# Patient Record
Sex: Female | Born: 1995 | Race: White | Hispanic: No | Marital: Single | Smoking: Current some day smoker
Health system: Southern US, Community
[De-identification: ages and names within clinical notes are randomized; demographics above are authoritative.]

---

## 2017-01-08 ENCOUNTER — Encounter (HOSPITAL_BASED_OUTPATIENT_CLINIC_OR_DEPARTMENT_OTHER): Payer: Self-pay | Admitting: Emergency Medicine

## 2017-01-08 ENCOUNTER — Emergency Department (HOSPITAL_BASED_OUTPATIENT_CLINIC_OR_DEPARTMENT_OTHER)
Admission: EM | Admit: 2017-01-08 | Discharge: 2017-01-08 | Disposition: A | Discharge status: Home / Self Care | Payer: Managed Care, Other (non HMO) | Attending: Emergency Medicine | Admitting: Emergency Medicine

## 2017-01-08 ENCOUNTER — Emergency Department (HOSPITAL_BASED_OUTPATIENT_CLINIC_OR_DEPARTMENT_OTHER): Payer: Managed Care, Other (non HMO)

## 2017-01-08 DIAGNOSIS — J040 Acute laryngitis: Secondary | ICD-10-CM | POA: Insufficient documentation

## 2017-01-08 DIAGNOSIS — Z87891 Personal history of nicotine dependence: Secondary | ICD-10-CM | POA: Insufficient documentation

## 2017-01-08 DIAGNOSIS — R590 Localized enlarged lymph nodes: Secondary | ICD-10-CM | POA: Diagnosis not present

## 2017-01-08 DIAGNOSIS — J029 Acute pharyngitis, unspecified: Secondary | ICD-10-CM | POA: Diagnosis present

## 2017-01-08 DIAGNOSIS — J042 Acute laryngotracheitis: Secondary | ICD-10-CM

## 2017-01-08 LAB — COMPREHENSIVE METABOLIC PANEL
ALK PHOS: 57 U/L (ref 38–126)
ALT: 16 U/L (ref 14–54)
ANION GAP: 10 (ref 5–15)
AST: 23 U/L (ref 15–41)
Albumin: 4.9 g/dL (ref 3.5–5.0)
BUN: 7 mg/dL (ref 6–20)
CALCIUM: 9.5 mg/dL (ref 8.9–10.3)
CO2: 27 mmol/L (ref 22–32)
CREATININE: 0.67 mg/dL (ref 0.44–1.00)
Chloride: 100 mmol/L — ABNORMAL LOW (ref 101–111)
Glucose, Bld: 93 mg/dL (ref 65–99)
Potassium: 3.6 mmol/L (ref 3.5–5.1)
SODIUM: 137 mmol/L (ref 135–145)
Total Bilirubin: 0.7 mg/dL (ref 0.3–1.2)
Total Protein: 8.3 g/dL — ABNORMAL HIGH (ref 6.5–8.1)

## 2017-01-08 LAB — CBC WITH DIFFERENTIAL/PLATELET
Basophils Absolute: 0 10*3/uL (ref 0.0–0.1)
Basophils Relative: 1 %
EOS ABS: 0 10*3/uL (ref 0.0–0.7)
EOS PCT: 1 %
HCT: 40.1 % (ref 36.0–46.0)
HEMOGLOBIN: 13.5 g/dL (ref 12.0–15.0)
LYMPHS ABS: 1.4 10*3/uL (ref 0.7–4.0)
LYMPHS PCT: 33 %
MCH: 29.5 pg (ref 26.0–34.0)
MCHC: 33.7 g/dL (ref 30.0–36.0)
MCV: 87.6 fL (ref 78.0–100.0)
MONOS PCT: 11 %
Monocytes Absolute: 0.5 10*3/uL (ref 0.1–1.0)
Neutro Abs: 2.3 10*3/uL (ref 1.7–7.7)
Neutrophils Relative %: 54 %
PLATELETS: 152 10*3/uL (ref 150–400)
RBC: 4.58 MIL/uL (ref 3.87–5.11)
RDW: 13.2 % (ref 11.5–15.5)
WBC: 4.2 10*3/uL (ref 4.0–10.5)

## 2017-01-08 LAB — URINALYSIS, ROUTINE W REFLEX MICROSCOPIC
BILIRUBIN URINE: NEGATIVE
GLUCOSE, UA: NEGATIVE mg/dL
HGB URINE DIPSTICK: NEGATIVE
KETONES UR: NEGATIVE mg/dL
Leukocytes, UA: NEGATIVE
Nitrite: NEGATIVE
PH: 7.5 (ref 5.0–8.0)
Protein, ur: NEGATIVE mg/dL
Specific Gravity, Urine: 1.003 — ABNORMAL LOW (ref 1.005–1.030)

## 2017-01-08 LAB — RAPID URINE DRUG SCREEN, HOSP PERFORMED
Amphetamines: NOT DETECTED
BARBITURATES: NOT DETECTED
Benzodiazepines: NOT DETECTED
Cocaine: NOT DETECTED
Opiates: NOT DETECTED
Tetrahydrocannabinol: NOT DETECTED

## 2017-01-08 LAB — PREGNANCY, URINE: PREG TEST UR: NEGATIVE

## 2017-01-08 LAB — MONONUCLEOSIS SCREEN: Mono Screen: NEGATIVE

## 2017-01-08 LAB — RAPID STREP SCREEN (MED CTR MEBANE ONLY): Streptococcus, Group A Screen (Direct): NEGATIVE

## 2017-01-08 MED ORDER — ACETAMINOPHEN 325 MG PO TABS
650.0000 mg | ORAL_TABLET | Freq: Once | ORAL | Status: AC
  Administered 2017-01-08: 650 mg via ORAL
  Filled 2017-01-08: qty 2

## 2017-01-08 MED ORDER — PREDNISONE 20 MG PO TABS: 40.0000 mg | ORAL_TABLET | Freq: Every day | ORAL | 0 refills | Status: AC

## 2017-01-08 MED ORDER — MELOXICAM 15 MG PO TABS: 15.0000 mg | ORAL_TABLET | Freq: Every day | ORAL | 0 refills | Status: AC

## 2017-01-08 NOTE — ED Triage Notes (Signed)
Patient states that she has had a sore throat x 2 -3 days

## 2017-01-08 NOTE — Discharge Instructions (Signed)
Your labs, and imaging of your chest were without any abnormalities today. You appear to have a viral infection. Please follow closely with a primary care doctor if your symptoms are worsening. Return to the ER if you have inability to swallow foods or fluids, inability to swallowing her own saliva, worsening swelling in the throat including difficulty breathing.

## 2017-01-08 NOTE — ED Notes (Signed)
ED-PA to bedside  

## 2017-01-08 NOTE — ED Provider Notes (Signed)
[facility]   CSN: [id number] Arrival date & time: 01/08/17  1647     History   Chief Complaint Chief Complaint  Patient presents with  . Sore Throat    HPI Carol Campbell is a 21 y.o. female who comes to the emergency department with her mother. She is from [place] and she is visiting this past week in [place], and she is beginning Doctor, hospital at [employer]. She states that 3 days initiated on Saturday, sore throat. She states that that night she ran a high fever and had soaking sweats. She states it is different from her normal strep throat infections because she has associated laryngitis and pain with swallowing deep in her throat. She has had decreased appetite. More concerning to her was that she developed tender, painful, bilateral axillary lymphadenopathy which is new. She has had some associated fatigue and malaise. She is able to tolerate fluids and soft foods. She denies stridor. She denies injuries to her throat. She denies unexplained weight loss, frequent fevers. 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 She previously had mono.   HPI  History reviewed. No pertinent past medical history.  There are no active problems to display for this patient.   History reviewed. No pertinent surgical history.  OB History    No data available       Home Medications    Prior to Admission medications   Not on File    Family History History reviewed. No pertinent family history.  Social History Social History  Substance Use Topics  . Smoking status: Current Some Day Smoker  . Smokeless tobacco: Never Used  . Alcohol use Yes     Comment: rare     Allergies   Patient has no known allergies.   Review of Systems Review of Systems   Physical Exam Updated Vital Signs BP 110/68 (BP Location: Left Arm)   Pulse 96   Temp 99.6 F (37.6 C) (Oral)   Resp 16   Ht 5\' 1"  (1.549 m)   Wt  54.4 kg (120 lb)   LMP 12/21/2016   SpO2 100%   BMI 22.67 kg/m   Physical Exam  Constitutional: She is oriented to person, place, and time. She appears well-developed and well-nourished. No distress.  HENT:  Head: Normocephalic and atraumatic.  Mouth/Throat: No oropharyngeal exudate, posterior oropharyngeal edema or posterior oropharyngeal erythema.  +Horse of voice  Eyes: Conjunctivae are normal. No scleral icterus.  Neck: Normal range of motion.  Cardiovascular: Normal rate, regular rhythm and normal heart sounds.  Exam reveals no gallop and no friction rub.   No murmur heard. Pulmonary/Chest: Effort normal and breath sounds normal. No respiratory distress.  Abdominal: Soft. Bowel sounds are normal. She exhibits no distension and no mass. There is no tenderness. There is no guarding.  Lymphadenopathy:    She has axillary adenopathy.  Neurological: She is alert and oriented to person, place, and time.  Skin: Skin is warm and dry. She is not diaphoretic.  Psychiatric: Her behavior is normal.  Nursing note and vitals reviewed.    ED Treatments / Results  Labs (all labs ordered are listed, but only abnormal results are displayed) Labs Reviewed  RAPID STREP SCREEN (NOT AT [facility])  CULTURE, GROUP A STREP ([facility])  MONONUCLEOSIS SCREEN  CBC WITH DIFFERENTIAL/PLATELET  COMPREHENSIVE METABOLIC PANEL  URINALYSIS, ROUTINE W REFLEX MICROSCOPIC  RAPID URINE DRUG SCREEN, HOSP PERFORMED  EKG  EKG Interpretation None       Radiology No results found.  Procedures Procedures (including critical care time)  Medications Ordered in ED Medications  acetaminophen (TYLENOL) tablet 650 mg (not administered)     Initial Impression / Assessment and Plan / ED Course  I have reviewed the triage vital signs and the nursing notes.  Pertinent labs & imaging results that were available during my care of the patient were reviewed by me and considered in my medical decision making (see  chart for details).     Patient Labs and imaging are without significant abnormality. No masses in the chest. I am unsure of the etiology of her axillary adenopathy, however, she does appear to have a viral infection). She has a hyperreactivity. She does have a history of mono and is at the right age to have a mononucleosis infection. At this point, patient will be treated as a viral infection with prednisone  And Nsaids. I discussed return precautions and patient is advised to follow closely with her primary care doctor Final Clinical Impressions(s) / ED Diagnoses   Final diagnoses:  None    New Prescriptions New Prescriptions   No medications on file     Arthor Captain, PA-C 01/09/17 0125    Mesner, Barbara Cower, MD 01/11/17 [phone]

## 2017-01-11 LAB — CULTURE, GROUP A STREP (THRC)

## 2018-11-07 IMAGING — DX DG CHEST 2V
2 series · 2 of 2 positions shown · non-contrast
Comparison: None.

CLINICAL DATA: Sore throat. Enlarged axillary lymph nodes.
Low-grade fever.

EXAM:
CHEST  2 VIEW

[chest pa]
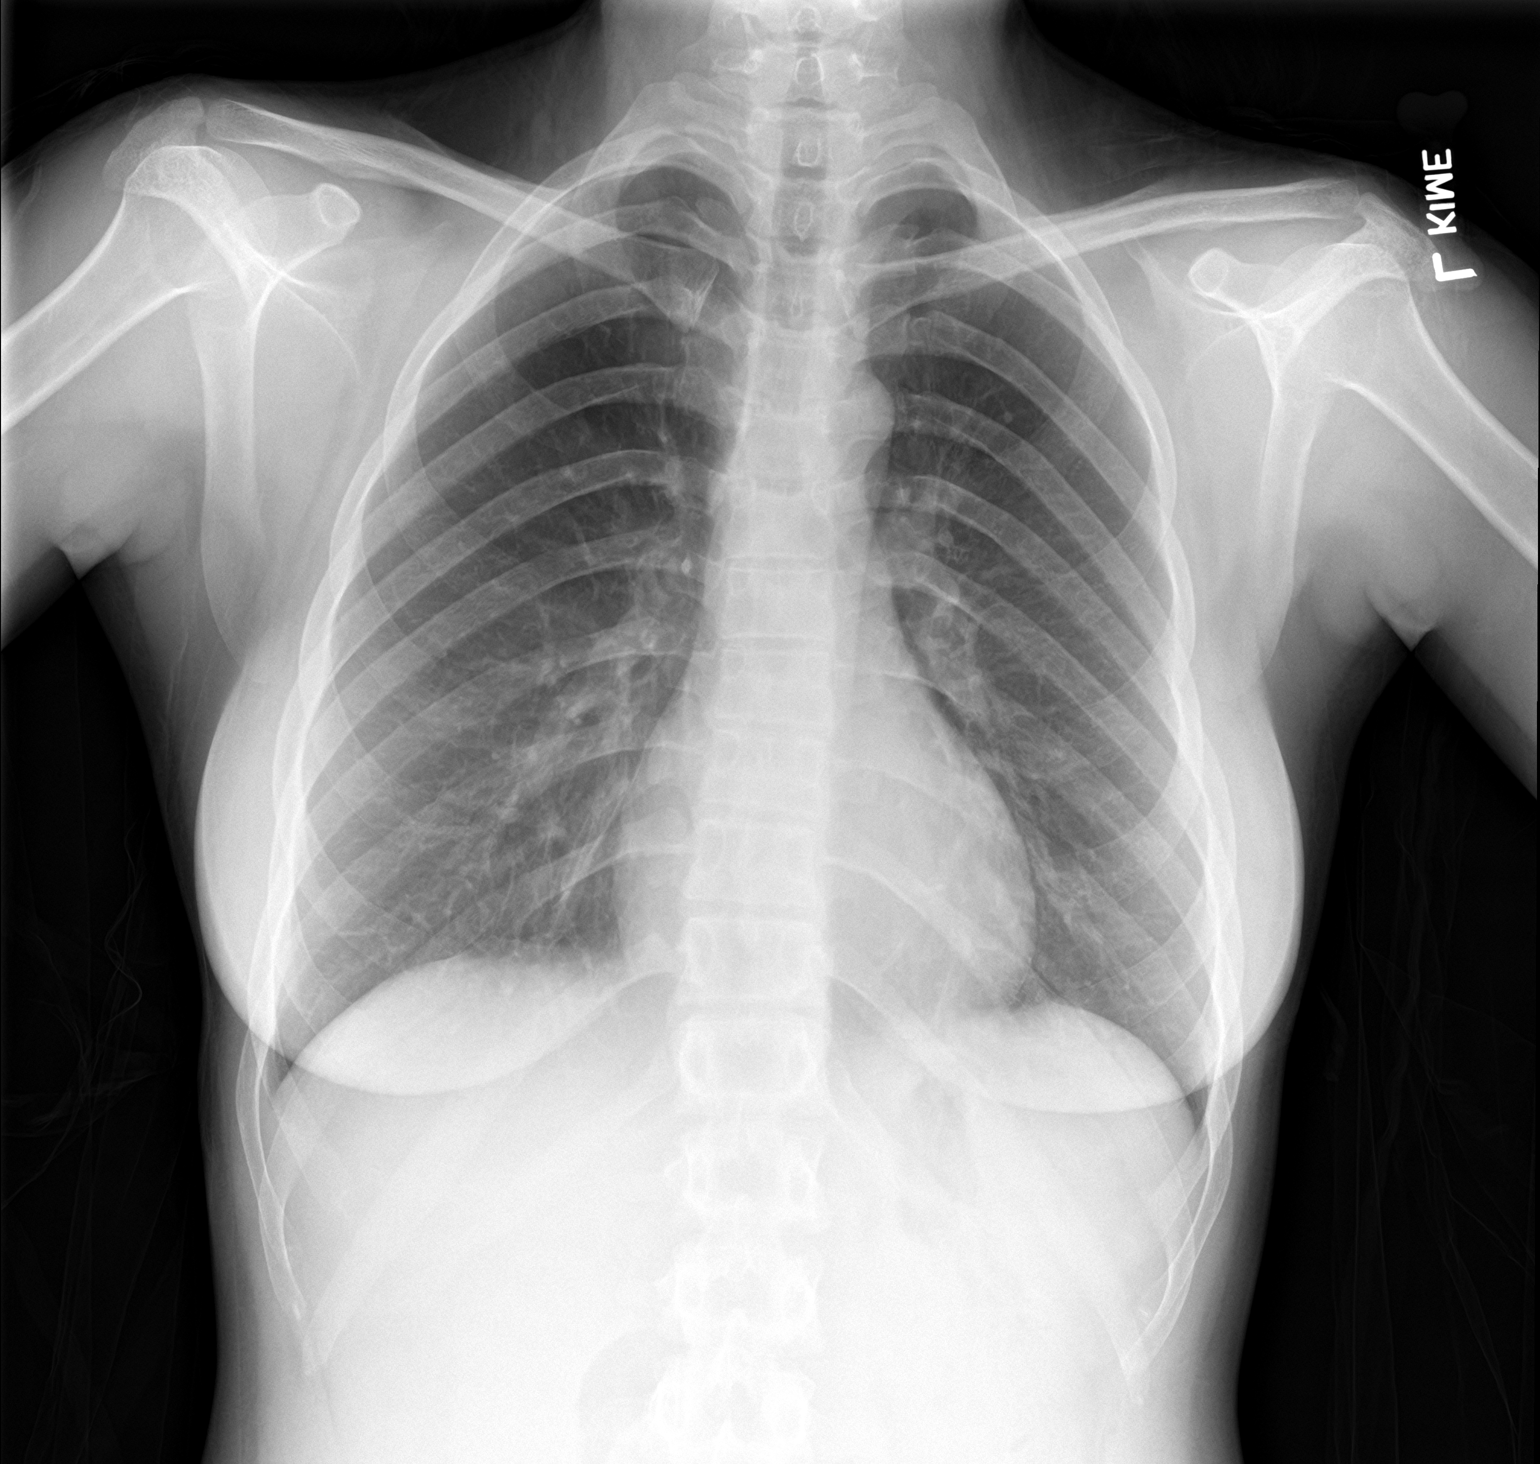

[chest lat]
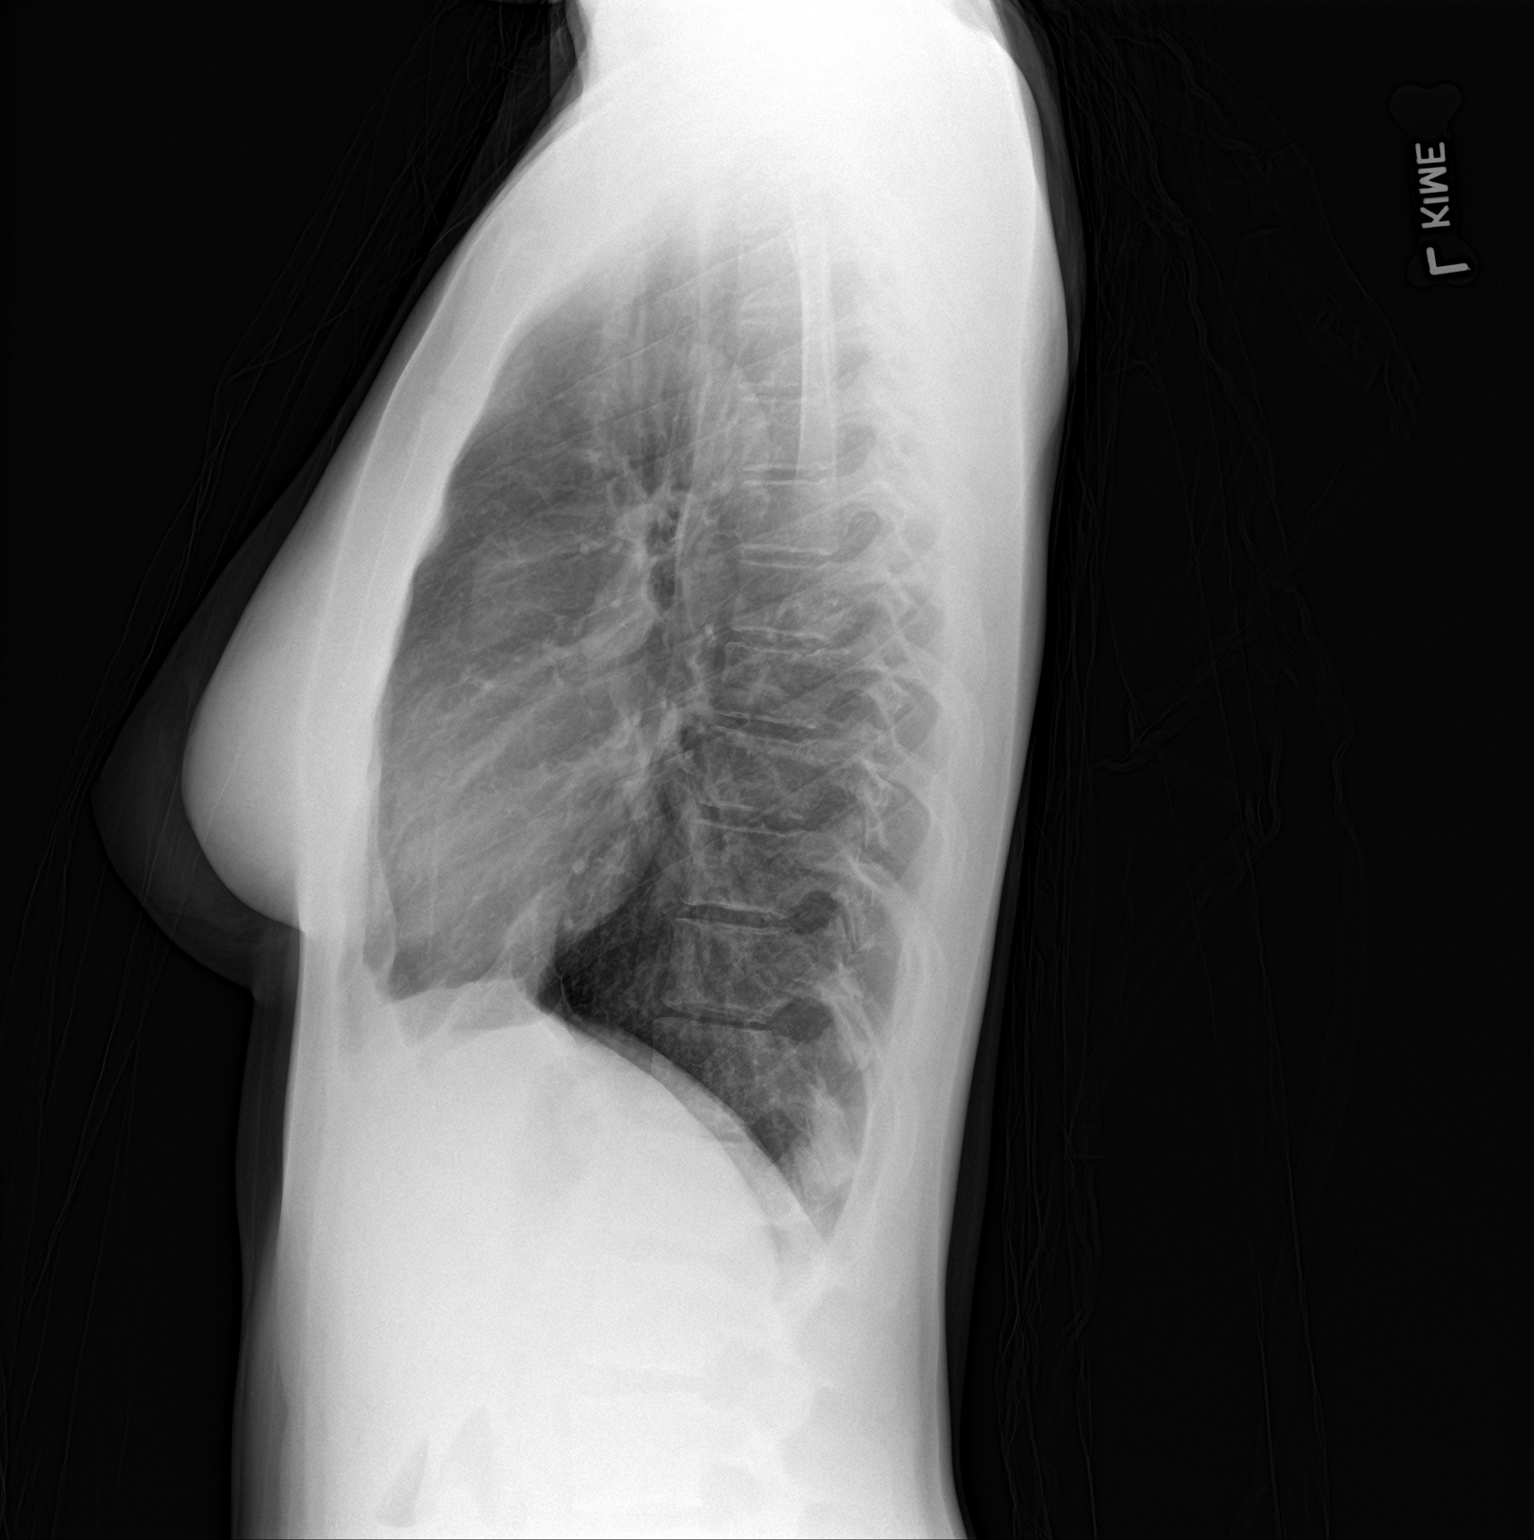

[2 of 2 positions shown; findings below may reference images not displayed]

FINDINGS: Heart size is normal. Mediastinal shadows are normal. The lungs are
clear. No bronchial thickening. No infiltrate, mass, effusion or
collapse. Pulmonary vascularity is normal. No bony abnormality.
IMPRESSION: Normal chest.  No visible adenopathy.
